# Patient Record
Sex: Female | Born: 2000 | Race: Black or African American | Hispanic: No | Marital: Single | Smoking: Never smoker
Health system: Southern US, Community
[De-identification: ages and names within clinical notes are randomized; demographics above are authoritative.]

## PROBLEM LIST (undated history)

## (undated) DIAGNOSIS — J45909 Unspecified asthma, uncomplicated: Secondary | ICD-10-CM

## (undated) DIAGNOSIS — J189 Pneumonia, unspecified organism: Secondary | ICD-10-CM

## (undated) DIAGNOSIS — T300 Burn of unspecified body region, unspecified degree: Secondary | ICD-10-CM

## (undated) DIAGNOSIS — D573 Sickle-cell trait: Secondary | ICD-10-CM

## (undated) DIAGNOSIS — H1010 Acute atopic conjunctivitis, unspecified eye: Secondary | ICD-10-CM

## (undated) HISTORY — DX: Acute atopic conjunctivitis, unspecified eye: H10.10

## (undated) HISTORY — DX: Burn of unspecified body region, unspecified degree: T30.0

## (undated) HISTORY — PX: SINOSCOPY: SHX187

---
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
## 2022-02-06 ENCOUNTER — Encounter: Payer: Self-pay | Admitting: Pediatrics

## 2022-03-28 ENCOUNTER — Other Ambulatory Visit: Payer: Self-pay | Admitting: Pediatrics

## 2022-03-28 DIAGNOSIS — H1031 Unspecified acute conjunctivitis, right eye: Secondary | ICD-10-CM | POA: Diagnosis not present

## 2022-03-28 DIAGNOSIS — K5909 Other constipation: Secondary | ICD-10-CM
